# Patient Record
Sex: Female | Born: 1995 | Race: Black or African American | Hispanic: No | Marital: Single
Health system: Southern US, Community
[De-identification: ages and names within clinical notes are randomized; demographics above are authoritative.]

---

## 2020-10-01 ENCOUNTER — Emergency Department (HOSPITAL_COMMUNITY)

## 2020-10-01 ENCOUNTER — Other Ambulatory Visit: Payer: Self-pay

## 2020-10-01 ENCOUNTER — Encounter (HOSPITAL_COMMUNITY): Payer: Self-pay | Admitting: Emergency Medicine

## 2020-10-01 ENCOUNTER — Emergency Department (HOSPITAL_COMMUNITY)
Admission: EM | Admit: 2020-10-01 | Discharge: 2020-10-02 | Disposition: A | Discharge status: Home / Self Care | Attending: Emergency Medicine | Admitting: Emergency Medicine

## 2020-10-01 DIAGNOSIS — R202 Paresthesia of skin: Secondary | ICD-10-CM | POA: Insufficient documentation

## 2020-10-01 DIAGNOSIS — M549 Dorsalgia, unspecified: Secondary | ICD-10-CM | POA: Diagnosis present

## 2020-10-01 DIAGNOSIS — G8929 Other chronic pain: Secondary | ICD-10-CM | POA: Insufficient documentation

## 2020-10-01 DIAGNOSIS — M545 Low back pain, unspecified: Secondary | ICD-10-CM | POA: Insufficient documentation

## 2020-10-01 LAB — CBC WITH DIFFERENTIAL/PLATELET
Abs Immature Granulocytes: 0.02 10*3/uL (ref 0.00–0.07)
Basophils Absolute: 0 10*3/uL (ref 0.0–0.1)
Basophils Relative: 0 %
Eosinophils Absolute: 0.1 10*3/uL (ref 0.0–0.5)
Eosinophils Relative: 1 %
HCT: 40.5 % (ref 36.0–46.0)
Hemoglobin: 13 g/dL (ref 12.0–15.0)
Immature Granulocytes: 0 %
Lymphocytes Relative: 35 %
Lymphs Abs: 3.2 10*3/uL (ref 0.7–4.0)
MCH: 30.2 pg (ref 26.0–34.0)
MCHC: 32.1 g/dL (ref 30.0–36.0)
MCV: 94 fL (ref 80.0–100.0)
Monocytes Absolute: 0.5 10*3/uL (ref 0.1–1.0)
Monocytes Relative: 6 %
Neutro Abs: 5.3 10*3/uL (ref 1.7–7.7)
Neutrophils Relative %: 58 %
Platelets: 230 10*3/uL (ref 150–400)
RBC: 4.31 MIL/uL (ref 3.87–5.11)
RDW: 13.2 % (ref 11.5–15.5)
WBC: 9.2 10*3/uL (ref 4.0–10.5)
nRBC: 0 % (ref 0.0–0.2)

## 2020-10-01 IMAGING — MR MR HEAD WO/W CM
14 of 16 series · 42 of 48 positions shown · IV contrast (gadavist)
Comparison: None.

CLINICAL DATA: Left lower extremity numbness

EXAM:
MRI HEAD WITHOUT AND WITH CONTRAST
MRI CERVICAL AND THORACIC SPINE WITHOUT AND WITH CONTRAST
TECHNIQUE: Multiplanar, multiecho pulse sequences of the brain and surrounding
structures, and cervical and thoracic spine, to include the
craniocervical junction and cervicothoracic junction, were obtained
without and with intravenous contrast.
CONTRAST:  5.5mL GADAVIST GADOBUTROL 1 MMOL/ML IV SOLN

[Series 5: DWI · axial · 3.0mm · 0.88mm/px · z∈[-100,+41]mm · 8 of 96 slices shown (1 of 4)]
[im 1/96]
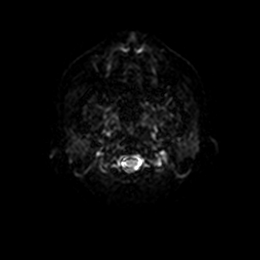
[im 14/96]
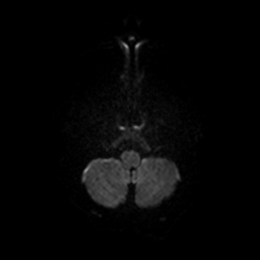
[im 28/96]
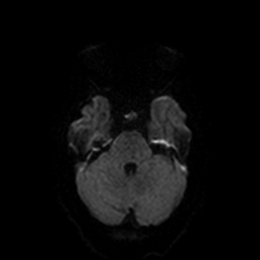
[im 41/96]
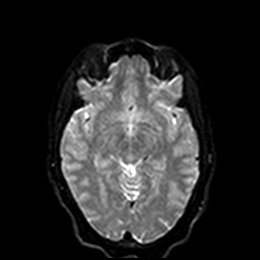
[im 55/96]
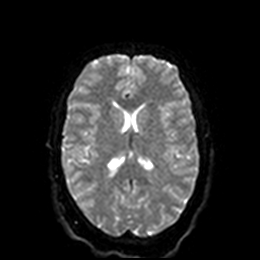
[im 68/96]
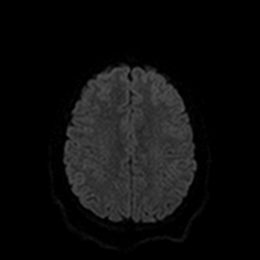
[im 82/96]
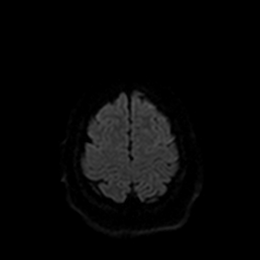
[im 96/96]
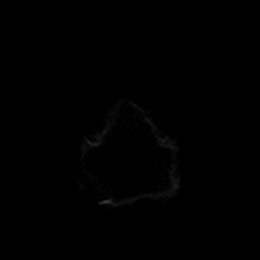

[Series 6: DWI · axial · 3.0mm · 0.88mm/px · z∈[-100,+41]mm · 3 of 48 slices shown (2 of 4)]
[im 1/48]
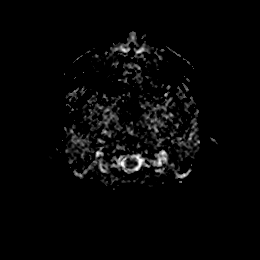
[im 24/48]
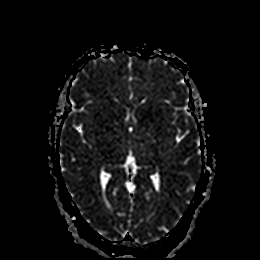
[im 48/48]
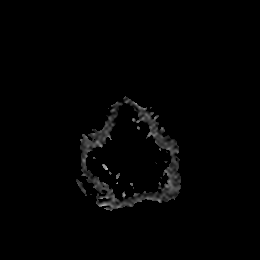

[Series 7: DWI · coronal · 4.0mm · 0.88mm/px · 5 of 68 slices shown (3 of 4)]
[im 1/68]
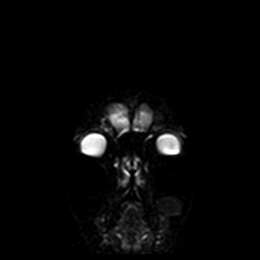
[im 17/68]
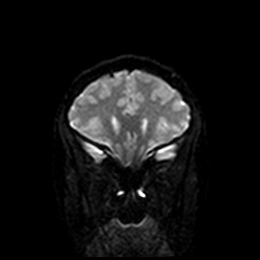
[im 34/68]
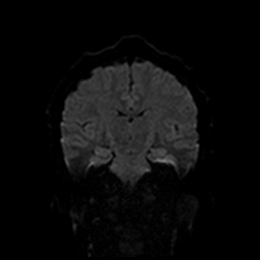
[im 51/68]
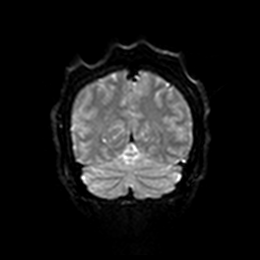
[im 68/68]
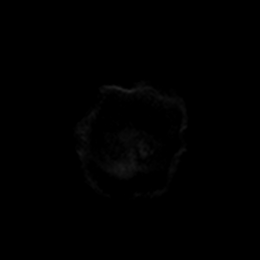

[Series 8: DWI · coronal · 4.0mm · 0.88mm/px · 2 of 34 slices shown (4 of 4)]
[im 1/34]
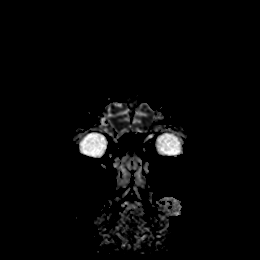
[im 34/34]
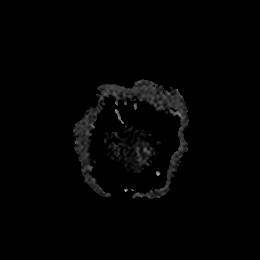

[Series 9: T1 · sagittal · 5.0mm · 0.75mm/px · 2 of 23 slices shown]
[im 1/23]
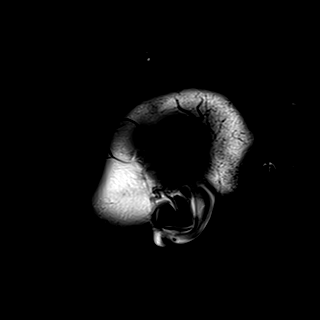
[im 23/23]
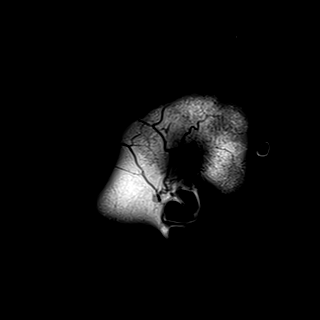

[Series 10: T2 · axial · 5.0mm · 0.72mm/px · z∈[-101,+43]mm · 2 of 25 slices shown]
[im 1/25]
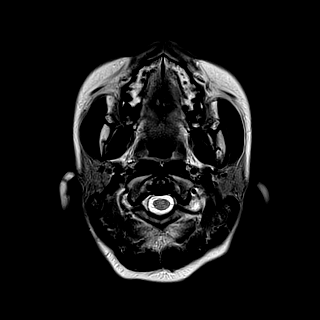
[im 25/25]
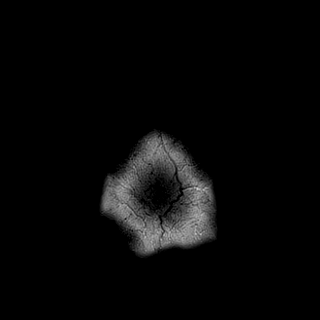

[Series 11: FLAIR · axial · 5.0mm · 0.45mm/px · z∈[-101,+43]mm · 2 of 25 slices shown]
[im 1/25]
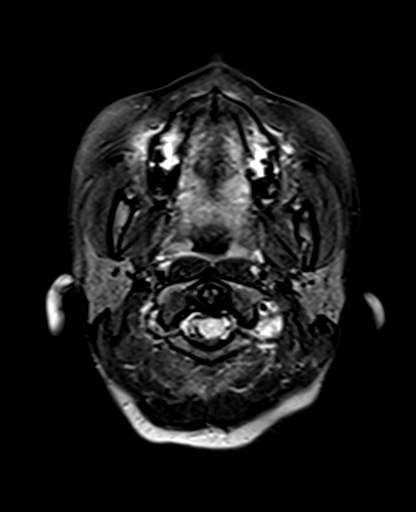
[im 25/25]
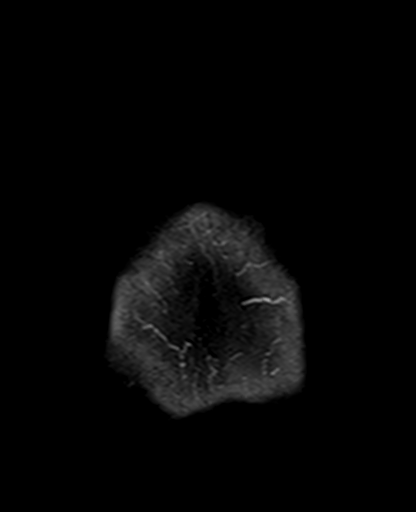

[Series 12: mag_images · axial · 3.0mm · 0.90mm/px · z∈[-99,+42]mm · 3 of 48 slices shown]
[im 1/48]
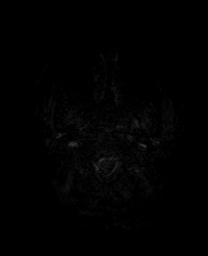
[im 24/48]
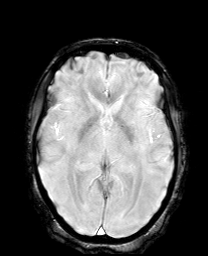
[im 48/48]
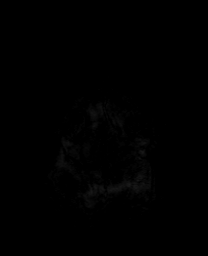

[Series 13: pha_images · axial · 3.0mm · 0.90mm/px · z∈[-99,+42]mm · 3 of 48 slices shown]
[im 1/48]
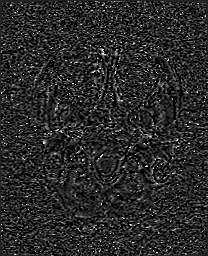
[im 24/48]
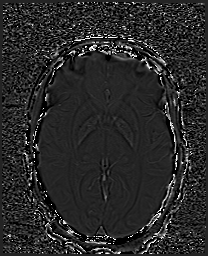
[im 48/48]
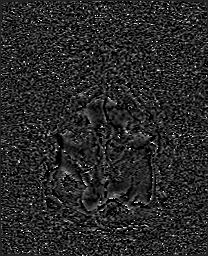

[Series 14: swi_images · axial · 3.0mm · 0.90mm/px · z∈[-99,+42]mm · 3 of 48 slices shown]
[im 1/48]
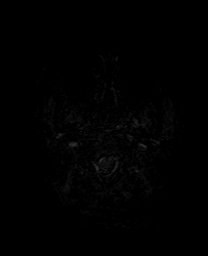
[im 24/48]
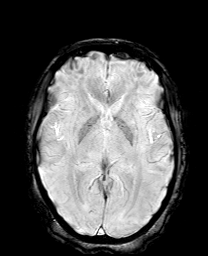
[im 48/48]
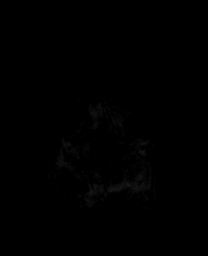

[Series 15: mip_images(sw) · axial · 24.0mm · 0.90mm/px · z∈[-89,+31]mm · 3 of 41 slices shown]
[im 1/41]
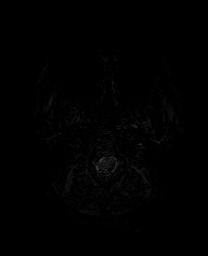
[im 21/41]
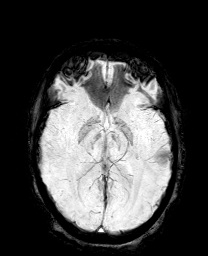
[im 41/41]
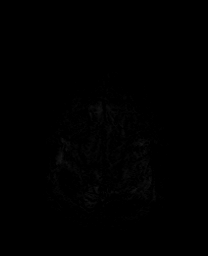

[Series 17: T2 post-contrast · coronal · 5.0mm · 0.72mm/px · 2 of 28 slices shown]
[im 1/28]
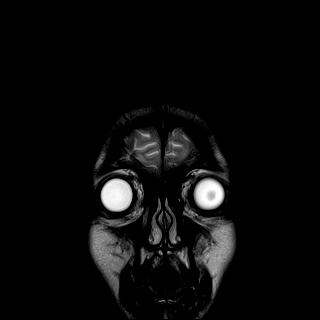
[im 28/28]
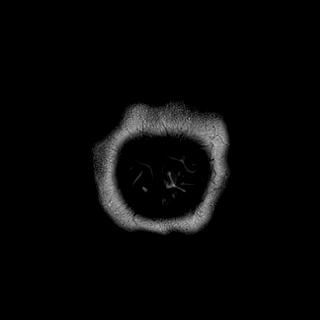

[Series 19: T1 post-contrast · coronal · 5.0mm · 0.34mm/px · 2 of 28 slices shown (1 of 2)]
[im 1/28]
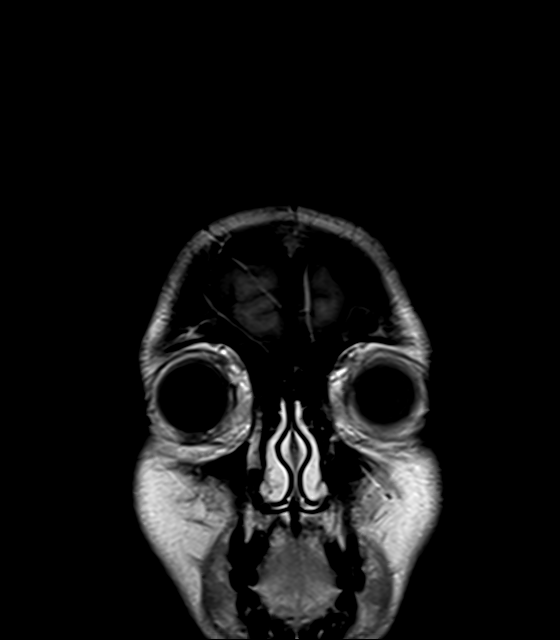
[im 28/28]
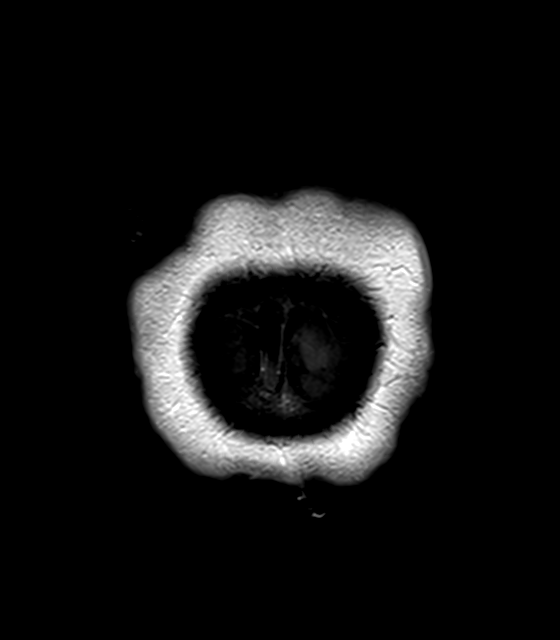

[Series 20: T1 post-contrast · sagittal · 5.0mm · 0.72mm/px · 2 of 23 slices shown (2 of 2)]
[im 1/23]
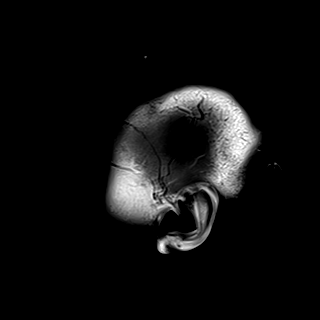
[im 23/23]
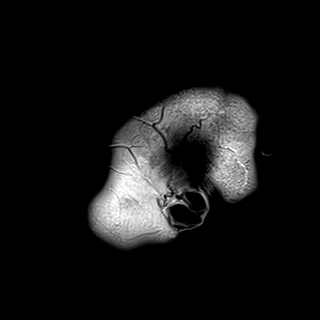

[42 of 48 positions shown; findings below may reference images not displayed]

FINDINGS: MRI HEAD FINDINGS

Brain: No acute infarct, acute hemorrhage or extra-axial collection.
Normal white matter signal. Normal volume of CSF spaces. No chronic
microhemorrhage. Normal midline structures.

Vascular: Major flow voids are preserved.

Skull and upper cervical spine: Normal calvarium and skull base.
Visualized upper cervical spine and soft tissues are normal.

Sinuses/Orbits:No paranasal sinus fluid levels or advanced mucosal
thickening. No mastoid or middle ear effusion. Normal orbits.

MRI CERVICAL SPINE FINDINGS

Alignment: Physiologic.

Vertebrae: No fracture, evidence of discitis, or bone lesion.

Cord: Normal signal and morphology.

Posterior Fossa, vertebral arteries, paraspinal tissues: Negative.

Disc levels: No spinal canal or neural foraminal stenosis.

MRI THORACIC SPINE FINDINGS

Alignment: Physiologic.

Vertebrae: No fracture, evidence of discitis, or bone lesion.

Cord: Normal signal and morphology.

Paraspinal tissues: Negative.

Disc levels: No disc herniation, spinal canal stenosis or neural
foraminal stenosis.
IMPRESSION: Normal MRI of the brain, cervical spine and thoracic spine.

## 2020-10-01 IMAGING — MR MR THORACIC SPINE WO/W CM
5 of 9 series · 23 of 48 positions shown · IV contrast (gadavist)
Comparison: None.

CLINICAL DATA: Left lower extremity numbness

EXAM:
MRI HEAD WITHOUT AND WITH CONTRAST
MRI CERVICAL AND THORACIC SPINE WITHOUT AND WITH CONTRAST
TECHNIQUE: Multiplanar, multiecho pulse sequences of the brain and surrounding
structures, and cervical and thoracic spine, to include the
craniocervical junction and cervicothoracic junction, were obtained
without and with intravenous contrast.
CONTRAST:  5.5mL GADAVIST GADOBUTROL 1 MMOL/ML IV SOLN

[Series 22: T1 · sagittal · 6.0mm · 1.23mm/px · 1 of 9 slices shown (1 of 3)]
[im 1/9]
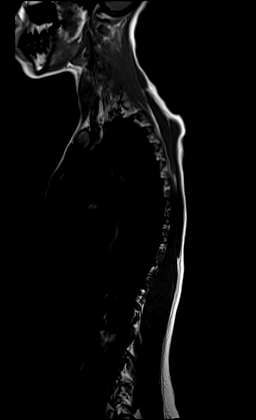

[Series 23: T2 · sagittal · 3.0mm · 0.76mm/px · 3 of 17 slices shown (1 of 2)]
[im 1/17]
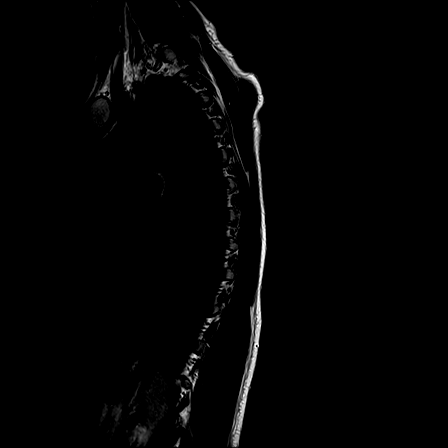
[im 9/17]
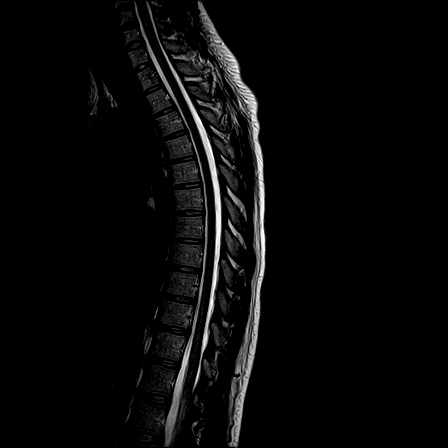
[im 17/17]
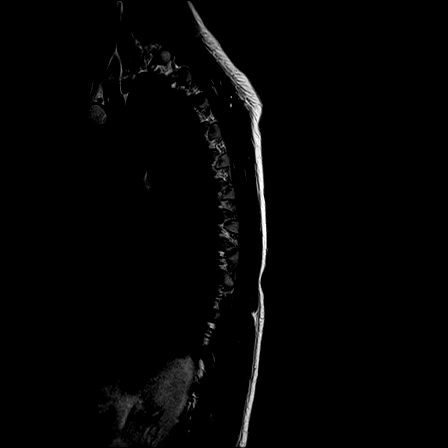

[Series 24: T1 · sagittal · 3.0mm · 0.76mm/px · 4 of 17 slices shown (2 of 3)]
[im 1/17]
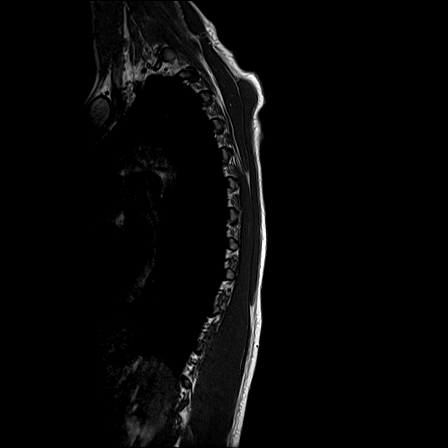
[im 6/17]
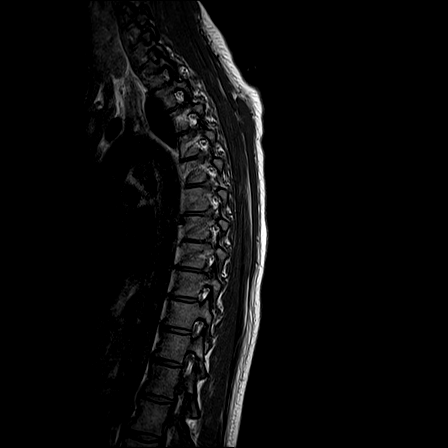
[im 11/17]
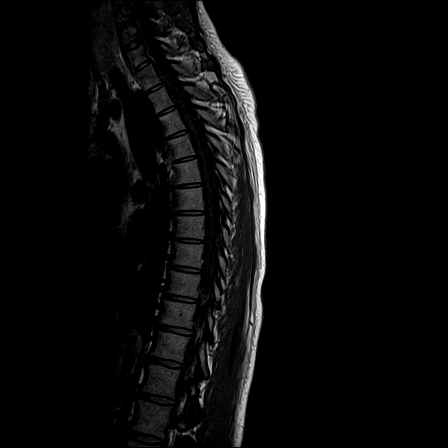
[im 17/17]
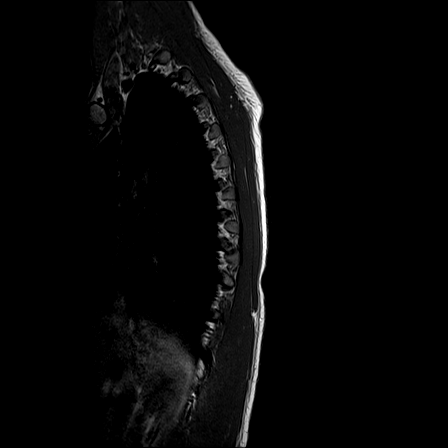

[Series 26: T2 · axial · 4.0mm · 0.59mm/px · z∈[-436,-229]mm · 8 of 39 slices shown (2 of 2)]
[im 1/39]
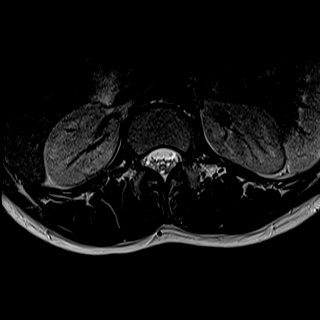
[im 6/39]
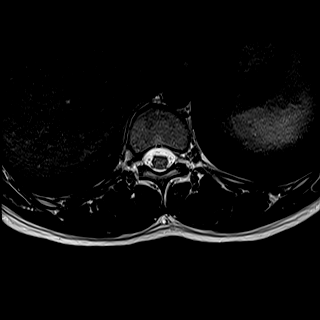
[im 11/39]
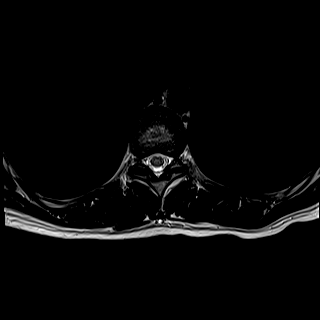
[im 17/39]
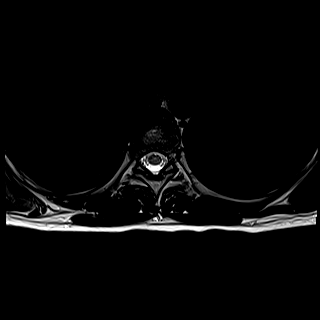
[im 22/39]
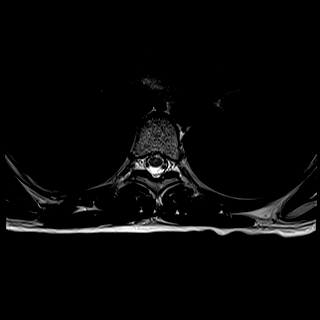
[im 28/39]
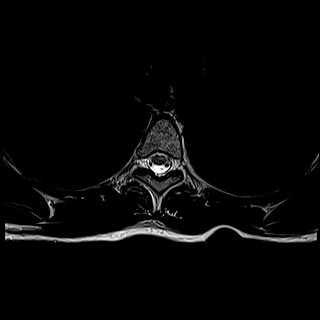
[im 33/39]
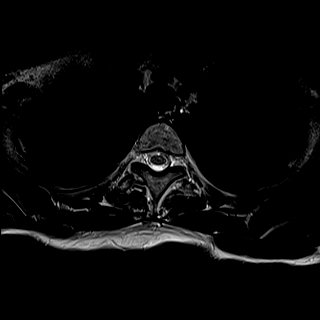
[im 39/39]
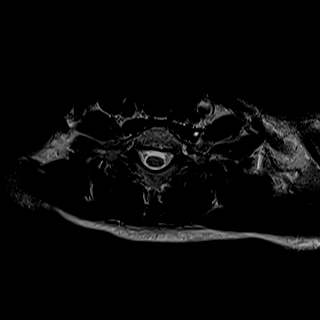

[Series 28: T1 · axial · non-contrast · 4.0mm · 0.31mm/px · z∈[-436,-266]mm · 7 of 39 slices shown (3 of 3)]
[im 1/39]
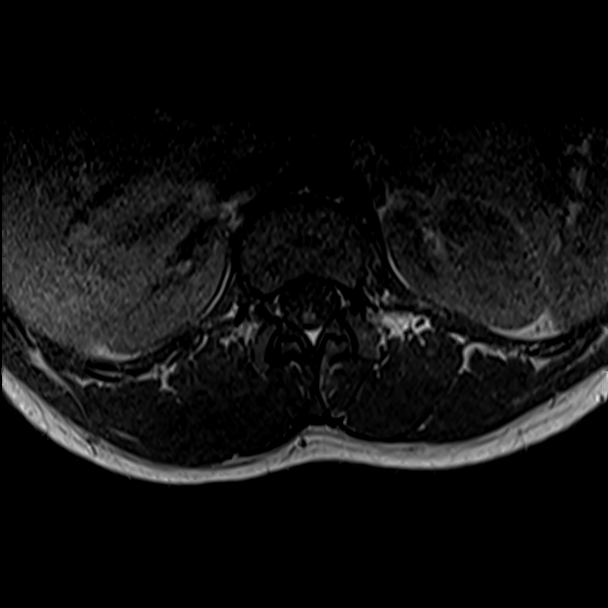
[im 6/39]
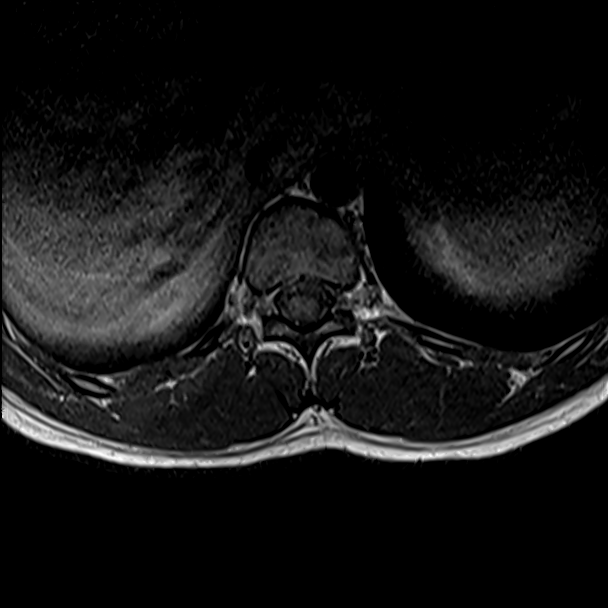
[im 11/39]
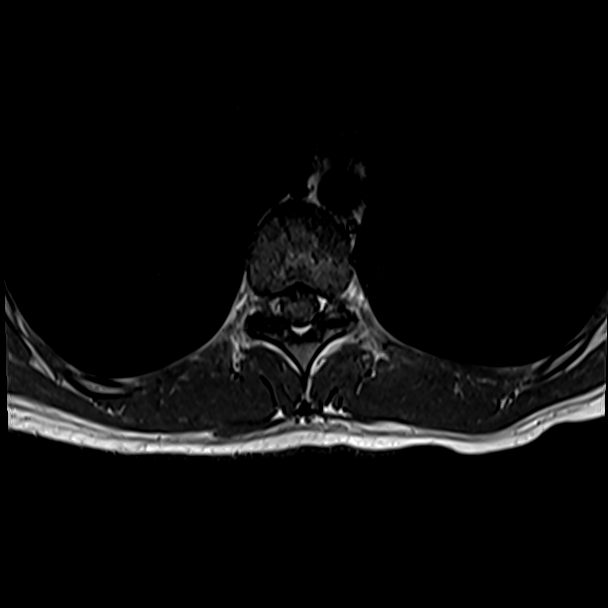
[im 17/39]
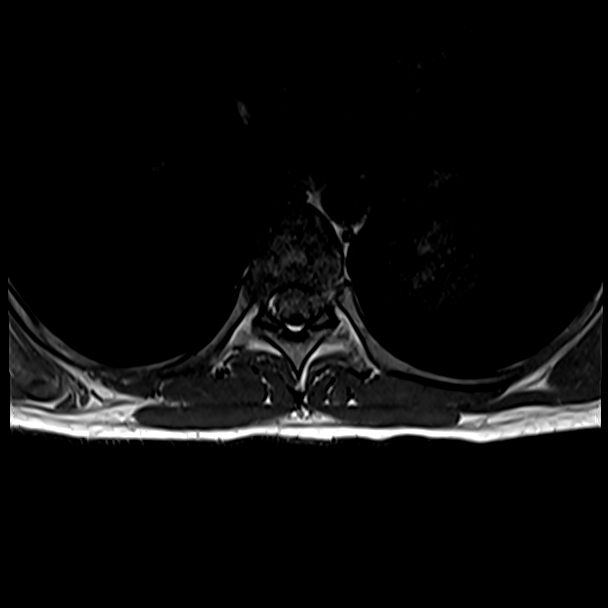
[im 22/39]
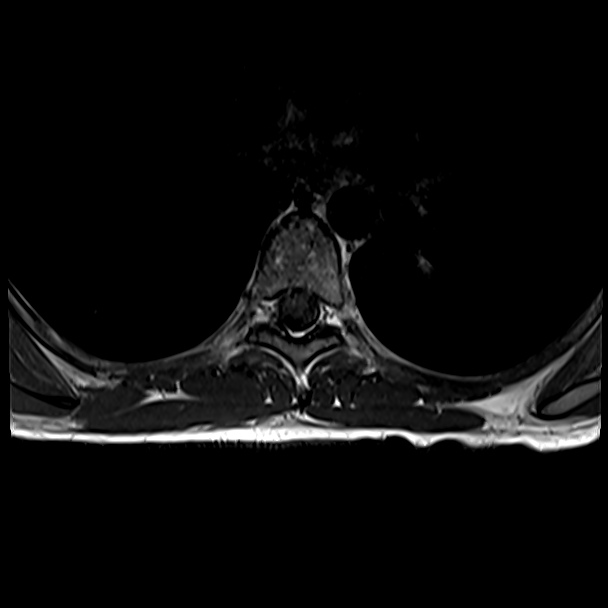
[im 28/39]
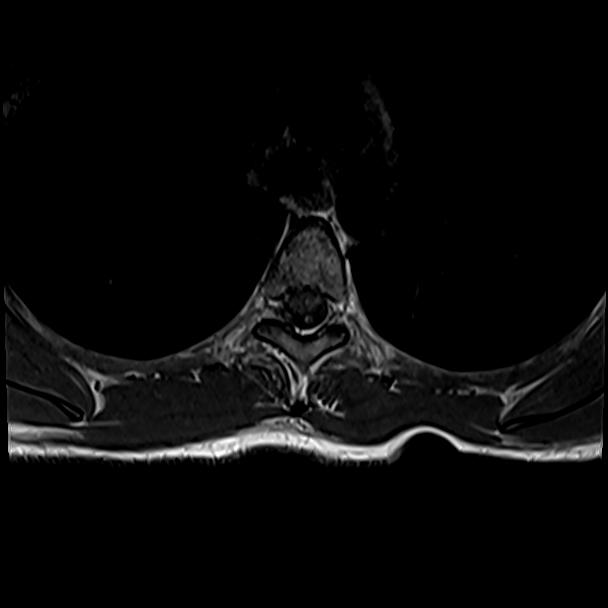
[im 33/39]
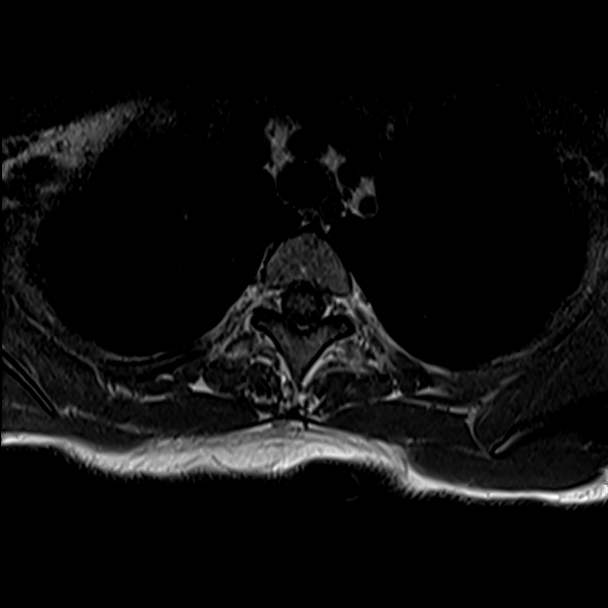

[23 of 48 positions shown; findings below may reference images not displayed]

FINDINGS: MRI HEAD FINDINGS

Brain: No acute infarct, acute hemorrhage or extra-axial collection.
Normal white matter signal. Normal volume of CSF spaces. No chronic
microhemorrhage. Normal midline structures.

Vascular: Major flow voids are preserved.

Skull and upper cervical spine: Normal calvarium and skull base.
Visualized upper cervical spine and soft tissues are normal.

Sinuses/Orbits:No paranasal sinus fluid levels or advanced mucosal
thickening. No mastoid or middle ear effusion. Normal orbits.

MRI CERVICAL SPINE FINDINGS

Alignment: Physiologic.

Vertebrae: No fracture, evidence of discitis, or bone lesion.

Cord: Normal signal and morphology.

Posterior Fossa, vertebral arteries, paraspinal tissues: Negative.

Disc levels: No spinal canal or neural foraminal stenosis.

MRI THORACIC SPINE FINDINGS

Alignment: Physiologic.

Vertebrae: No fracture, evidence of discitis, or bone lesion.

Cord: Normal signal and morphology.

Paraspinal tissues: Negative.

Disc levels: No disc herniation, spinal canal stenosis or neural
foraminal stenosis.
IMPRESSION: Normal MRI of the brain, cervical spine and thoracic spine.

## 2020-10-01 IMAGING — MR MR CERVICAL SPINE WO/W CM
6 of 8 series · 31 of 48 positions shown · IV contrast (gadavist)
Comparison: None.

CLINICAL DATA: Left lower extremity numbness

EXAM:
MRI HEAD WITHOUT AND WITH CONTRAST
MRI CERVICAL AND THORACIC SPINE WITHOUT AND WITH CONTRAST
TECHNIQUE: Multiplanar, multiecho pulse sequences of the brain and surrounding
structures, and cervical and thoracic spine, to include the
craniocervical junction and cervicothoracic junction, were obtained
without and with intravenous contrast.
CONTRAST:  5.5mL GADAVIST GADOBUTROL 1 MMOL/ML IV SOLN

[Series 13: T2 · sagittal · 3.0mm · 0.69mm/px · 4 of 15 slices shown (1 of 2)]
[im 1/15]
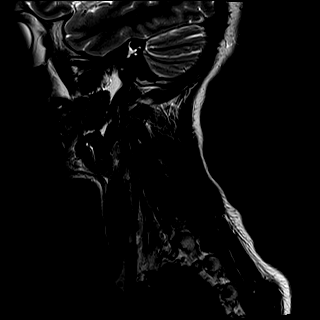
[im 5/15]
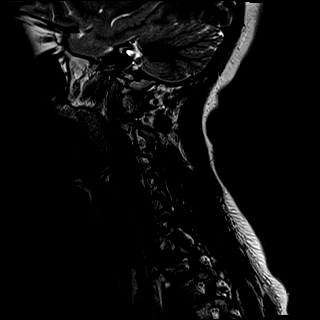
[im 10/15]
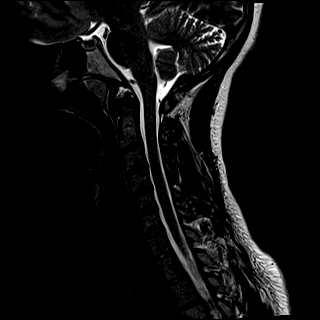
[im 15/15]
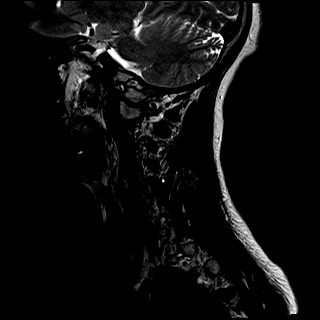

[Series 14: T1 · sagittal · 3.0mm · 0.69mm/px · 4 of 15 slices shown (1 of 2)]
[im 1/15]
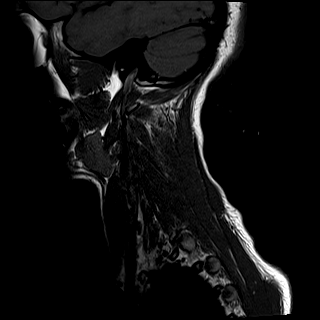
[im 5/15]
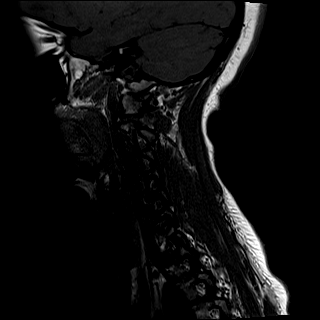
[im 10/15]
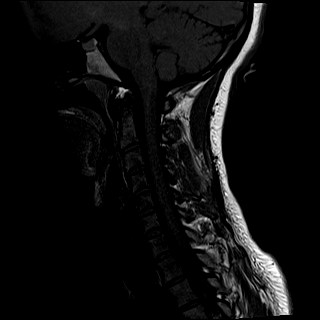
[im 15/15]
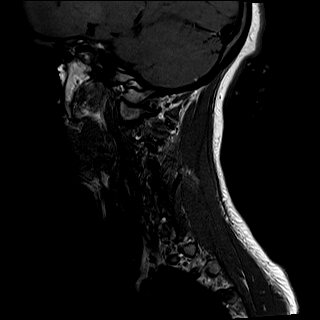

[Series 15: STIR · sagittal · 3.0mm · 0.86mm/px · 4 of 15 slices shown]
[im 1/15]
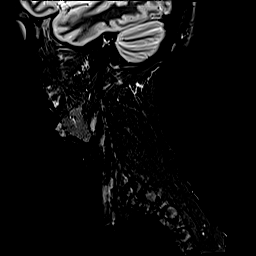
[im 5/15]
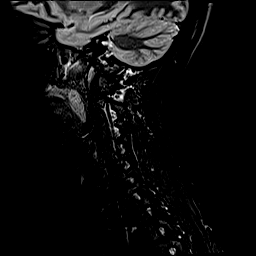
[im 10/15]
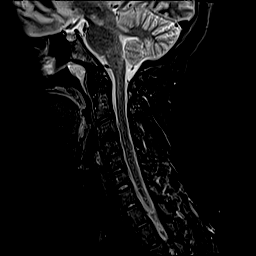
[im 15/15]
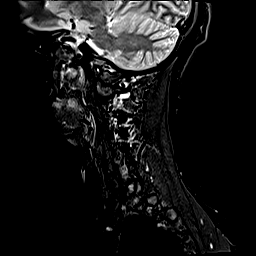

[Series 16: T2 · axial · 3.0mm · 0.66mm/px · z∈[-249,-137]mm · 8 of 37 slices shown (2 of 2)]
[im 1/37]
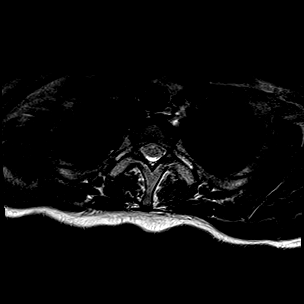
[im 6/37]
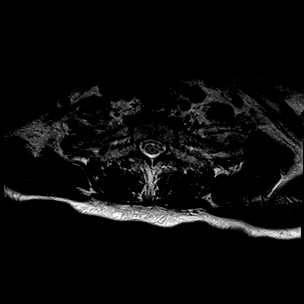
[im 11/37]
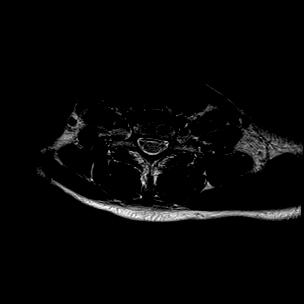
[im 16/37]
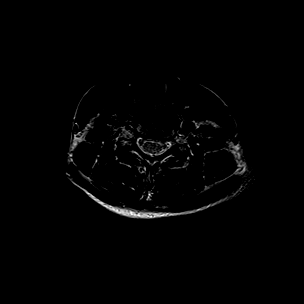
[im 21/37]
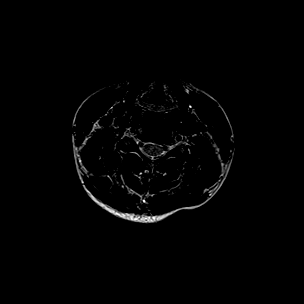
[im 26/37]
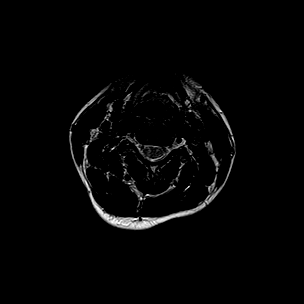
[im 31/37]
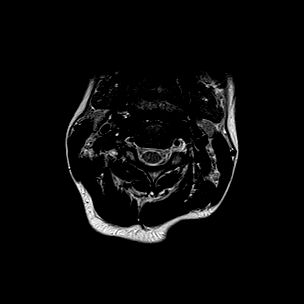
[im 37/37]
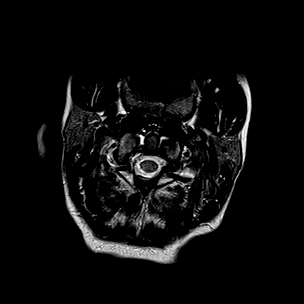

[Series 18: T1 · axial · 3.0mm · 0.39mm/px · z∈[-249,-137]mm · 8 of 37 slices shown (2 of 2)]
[im 1/37]
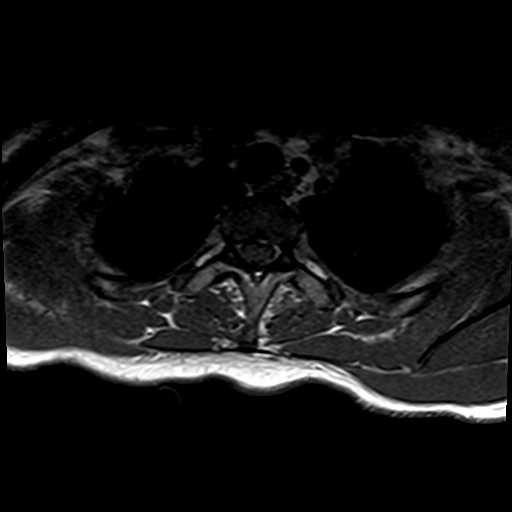
[im 6/37]
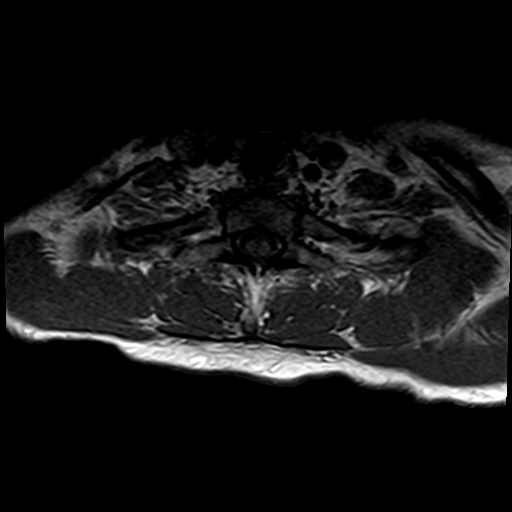
[im 11/37]
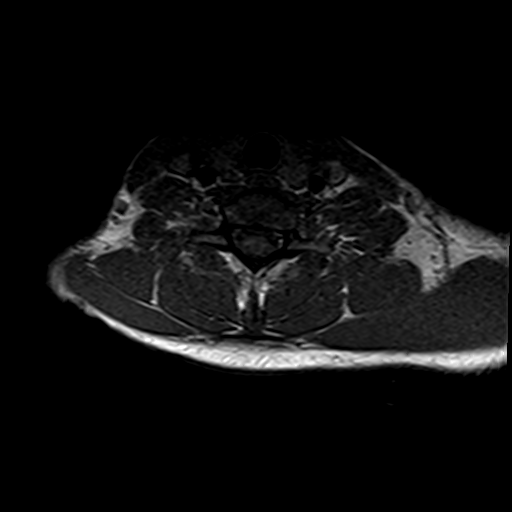
[im 16/37]
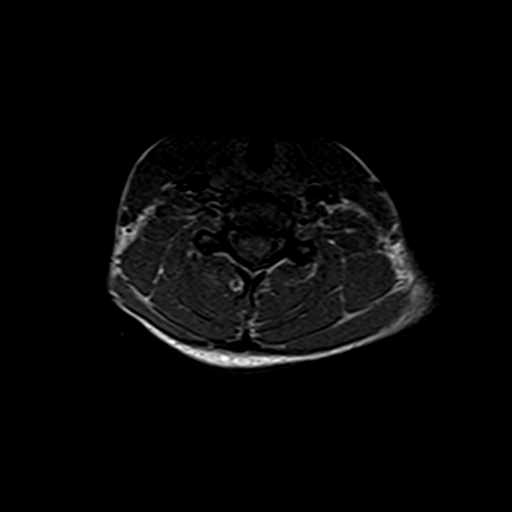
[im 21/37]
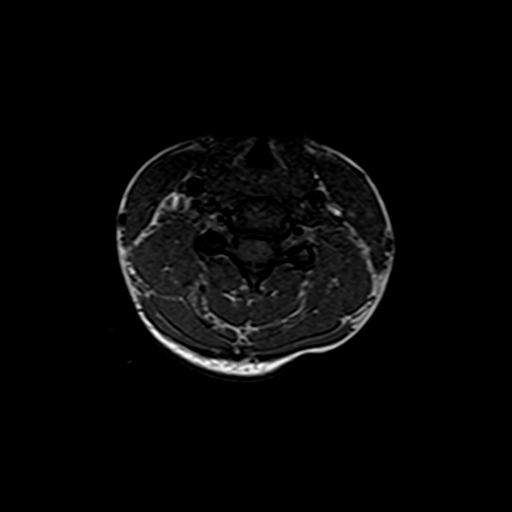
[im 26/37]
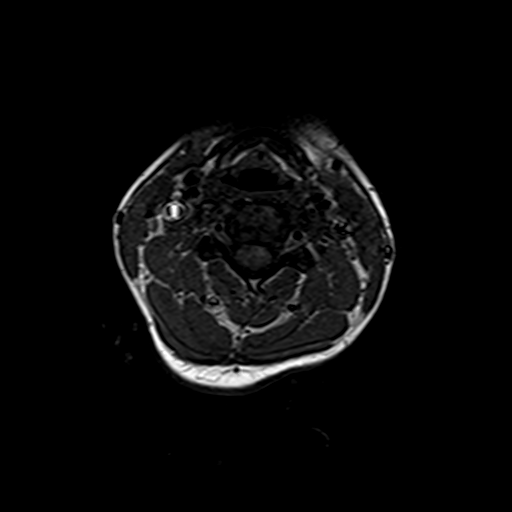
[im 31/37]
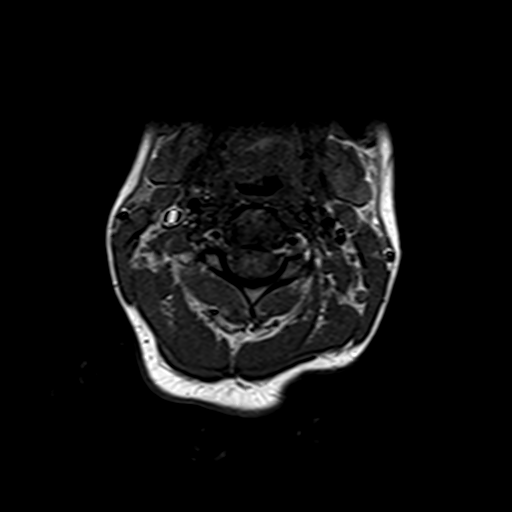
[im 37/37]
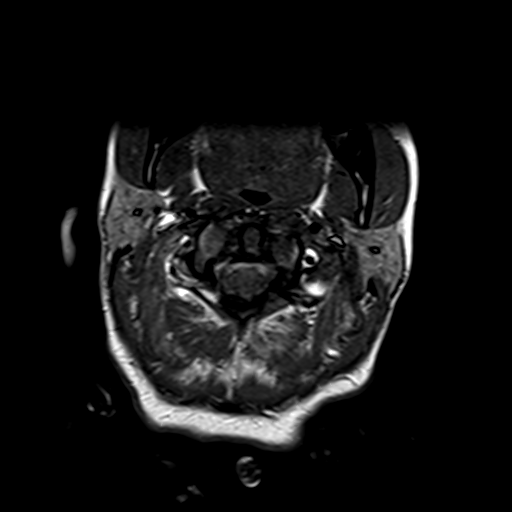

[Series 19: T1 fat-sat post-contrast · sagittal · 3.0mm · 0.43mm/px · 3 of 15 slices shown]
[im 1/15]
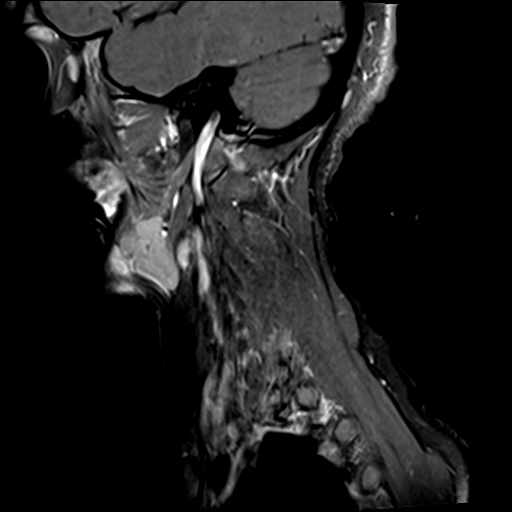
[im 8/15]
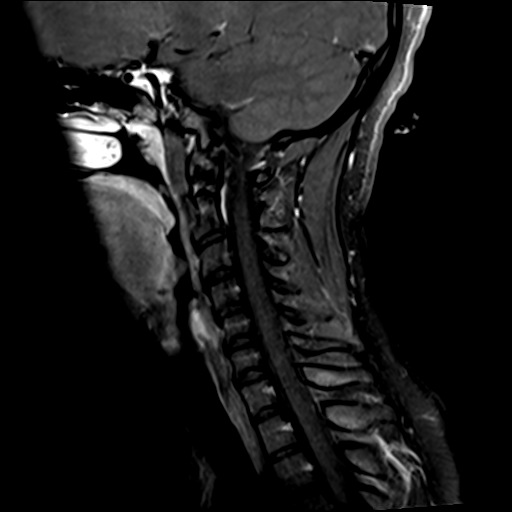
[im 15/15]
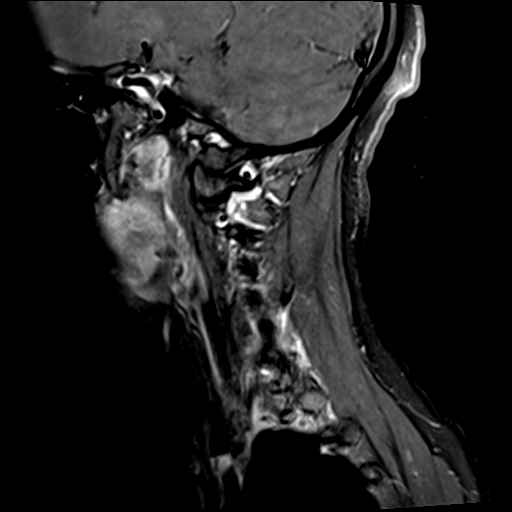

[31 of 48 positions shown; findings below may reference images not displayed]

FINDINGS: MRI HEAD FINDINGS

Brain: No acute infarct, acute hemorrhage or extra-axial collection.
Normal white matter signal. Normal volume of CSF spaces. No chronic
microhemorrhage. Normal midline structures.

Vascular: Major flow voids are preserved.

Skull and upper cervical spine: Normal calvarium and skull base.
Visualized upper cervical spine and soft tissues are normal.

Sinuses/Orbits:No paranasal sinus fluid levels or advanced mucosal
thickening. No mastoid or middle ear effusion. Normal orbits.

MRI CERVICAL SPINE FINDINGS

Alignment: Physiologic.

Vertebrae: No fracture, evidence of discitis, or bone lesion.

Cord: Normal signal and morphology.

Posterior Fossa, vertebral arteries, paraspinal tissues: Negative.

Disc levels: No spinal canal or neural foraminal stenosis.

MRI THORACIC SPINE FINDINGS

Alignment: Physiologic.

Vertebrae: No fracture, evidence of discitis, or bone lesion.

Cord: Normal signal and morphology.

Paraspinal tissues: Negative.

Disc levels: No disc herniation, spinal canal stenosis or neural
foraminal stenosis.
IMPRESSION: Normal MRI of the brain, cervical spine and thoracic spine.

## 2020-10-01 NOTE — ED Provider Notes (Signed)
[facility] EMERGENCY DEPARTMENT Provider Note   CSN: [id number] Arrival date & time: 10/01/20  1809     History Chief Complaint  Patient presents with  . Back Pain  . Numbness    Dawn Chavez is a 24 y.o. female with a past medical history of "bulging disc in my lower back," diagnosed several years ago after an accident presenting to the ED with a chief complaint of back pain and left lower extremity numbness.  States that she typically gets lower back pain that is not unusual for her.  Yesterday about 24 hours ago started developing numbness in her left lower extremity.  She states "my leg feels cold, like there's no circulation, and it feels different when you touch it."  This starts in her left knee and goes down to her ankle.  It does not involve the thigh or the foot.  States that the symptoms have never happened to her before.  She remains ambulatory but does have trouble "getting my footing right."  She denies any chest pain, fever, prior back surgeries, loss of bowel or bladder function, saddle anesthesia, injuries, falls, IV drug use, possibility of pregnancy.  She had been prescribed muscle relaxer for her back pain in the past but has not needed to take it. Denies headache.  HPI     History reviewed. No pertinent past medical history.  There are no problems to display for this patient.    OB History   No obstetric history on file.     History reviewed. No pertinent family history.  Social History   Tobacco Use  . Smoking status: Not on file  Substance Use Topics  . Alcohol use: Not on file  . Drug use: Not on file    Home Medications Prior to Admission medications   Not on File    Allergies    Patient has no known allergies.  Review of Systems   Review of Systems  Constitutional: Negative for appetite change, chills and fever.  HENT: Negative for ear pain, rhinorrhea, sneezing and sore throat.   Eyes: Negative for photophobia  and visual disturbance.  Respiratory: Negative for cough, chest tightness, shortness of breath and wheezing.   Cardiovascular: Negative for chest pain and palpitations.  Gastrointestinal: Negative for abdominal pain, blood in stool, constipation, diarrhea, nausea and vomiting.  Genitourinary: Negative for dysuria, hematuria and urgency.  Musculoskeletal: Positive for back pain. Negative for myalgias.  Skin: Negative for rash.  Neurological: Positive for numbness. Negative for dizziness, weakness and light-headedness.    Physical Exam Updated Vital Signs BP 127/74 (BP Location: Right Arm)   Pulse 73   Temp 98.4 F (36.9 C) (Oral)   Resp 15   Ht 5\' 2"  (1.575 m)   Wt 54.4 kg   SpO2 98%   BMI 21.95 kg/m   Physical Exam Vitals and nursing note reviewed.  Constitutional:      General: She is not in acute distress.    Appearance: She is well-developed.  HENT:     Head: Normocephalic and atraumatic.     Nose: Nose normal.  Eyes:     General: No scleral icterus.       Right eye: No discharge.        Left eye: No discharge.     Conjunctiva/sclera: Conjunctivae normal.  Cardiovascular:     Rate and Rhythm: Normal rate and regular rhythm.     Heart sounds: Normal heart sounds. No murmur heard.  No  friction rub. No gallop.   Pulmonary:     Effort: Pulmonary effort is normal. No respiratory distress.     Breath sounds: Normal breath sounds.  Abdominal:     General: Bowel sounds are normal. There is no distension.     Palpations: Abdomen is soft.     Tenderness: There is no abdominal tenderness. There is no guarding.  Musculoskeletal:        General: Normal range of motion.     Cervical back: Normal range of motion and neck supple.  Skin:    General: Skin is warm and dry.     Findings: No rash.  Neurological:     Mental Status: She is alert.     Sensory: Sensory deficit present.     Motor: No weakness or abnormal muscle tone.     Coordination: Coordination normal.      Comments: Strength 5/5 in bilateral lower extremities.  Patient reports diminished sensation to palpation of her left lower leg circumferential from left knee to left ankle.  No significant swelling noted.  2+ DP pulse noted bilaterally.  No clonus noted.     ED Results / Procedures / Treatments   Labs (all labs ordered are listed, but only abnormal results are displayed) Labs Reviewed  CBC WITH DIFFERENTIAL/PLATELET  BASIC METABOLIC PANEL    EKG None  Radiology No results found.  Procedures Procedures (including critical care time)  Medications Ordered in ED Medications - No data to display  ED Course  I have reviewed the triage vital signs and the nursing notes.  Pertinent labs & imaging results that were available during my care of the patient were reviewed by me and considered in my medical decision making (see chart for details).  Clinical Course as of Oct 01 2299  Tue Oct 01, 2020  2137 Spoke to on-call neurologist, Dr. Iver Nestle. Reviewed patient's physical exam findings, symptoms and history.  She will see the patient in consult but in the meantime recommends MRI of the brain, cervical and thoracic spine which she has placed orders for.   [HK]  2258 No abnormalities.  CBC with Differential [HK]    Clinical Course User Index [HK] Dietrich Pates, PA-C   MDM Rules/Calculators/A&P                          24 year old female with a past medical history of bulging disc in lower back diagnosed several years ago after an accident presenting to the ED with a chief complaint of back pain and left lower extremity numbness.  States that getting her lower back pain is not unusual for her.  Yesterday about 24 hours ago started developing numbness in her left lower extremity from her knee down to her calf circumferentially.  Reports feeling like there is a numbness, decreased sensation and circulation to the area.  Remains ambulatory.  She also reports paresthesias in her left upper  extremity that occurred yesterday which is not typical for her as well.  Denies any numbness in this area.  Denies headache, saddle anesthesia, loss of bowel or bladder function, IV drug use.  On exam patient reports diminished sensation to the left lower leg area circumferentially.  Equal and intact distal pulses.  Good strength noted to bilateral upper and lower extremities.  Normal sensation light touch of bilateral upper extremities.  No clonus noted.  No significant edema or calf tenderness noted bilaterally.  Per neurology recommendations will obtain MRI of  the brain, cervical spine and thoracic spine to evaluate for any demyelinating disease.  Patient also would like to be checked for DVT.  We do not have ultrasound available at this time but I will arrange for this outpatient in the morning in the event that she gets discharged from the ER tonight after unremarkable workup. I appreciate the help of neurologist for management of this patient. Care handed off to oncoming provider pending remainder of workup to determine final disposition.  Portions of this note were generated with Scientist, clinical (histocompatibility and immunogenetics). Dictation errors may occur despite best attempts at proofreading.   Final Clinical Impression(s) / ED Diagnoses Final diagnoses:  Chronic midline low back pain without sciatica    Rx / DC Orders ED Discharge Orders         Ordered    VAS [place] LOWER EXTREMITY VENOUS (DVT)        10/01/20 2258           Dietrich Pates, PA-C 10/01/20 2305    Sabino Donovan, MD 10/01/20 [phone]
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
## 2020-10-01 NOTE — ED Provider Notes (Signed)
24 year old female received at sign out from [facility] pending MRI and neuro consult. Per her HPI:   "NAYA ILAGAN is a 24 y.o. female with a past medical history of "bulging disc in my lower back," diagnosed several years ago after an accident presenting to the ED with a chief complaint of back pain and left lower extremity numbness.  States that she typically gets lower back pain that is not unusual for her.  Yesterday about 24 hours ago started developing numbness in her left lower extremity.  She states "my leg feels cold, like there's no circulation, and it feels different when you touch it."  This starts in her left knee and goes down to her ankle.  It does not involve the thigh or the foot.  States that the symptoms have never happened to her before.  She remains ambulatory but does have trouble "getting my footing right."  She denies any chest pain, fever, prior back surgeries, loss of bowel or bladder function, saddle anesthesia, injuries, falls, IV drug use, possibility of pregnancy.  She had been prescribed muscle relaxer for her back pain in the past but has not needed to take it. Denies headache."  Physical Exam  BP 127/74 (BP Location: Right Arm)   Pulse 73   Temp 98.4 F (36.9 C) (Oral)   Resp 15   Ht 5\' 2"  (1.575 m)   Wt 54.4 kg   SpO2 98%   BMI 21.95 kg/m   Physical Exam Vitals and nursing note reviewed.  Constitutional:      Appearance: She is well-developed.  HENT:     Head: Normocephalic and atraumatic.  Musculoskeletal:     Cervical back: Neck supple.     Right lower leg: No edema.     Left lower leg: No edema.  Neurological:     Mental Status: She is alert and oriented to person, place, and time.     ED Course/Procedures   Clinical Course as of Oct 01 2316  Tue Oct 01, 2020  2137 Spoke to on-call neurologist, Dr. Oct 03, 2020. Reviewed patient's physical exam findings, symptoms and history.  She will see the patient in consult but in the meantime recommends MRI  of the brain, cervical and thoracic spine which she has placed orders for.   [HK]  2258 No abnormalities.  CBC with Differential [HK]    Clinical Course User Index [HK] 2259, PA-C    Procedures  MDM  24 year old female received a signout from [facility] pending neurology consult and MRI.  Please see her note for further work-up and medical decision making.  In brief, patient is presenting with low back pain and left lower extremity numbness that extends from her knee to her calf circumferentially.  Imaging has been reviewed and independently interpreted by me.  There are no acute findings on MRI.  The patient was evaluated by Dr. [facility], neurology, who has reviewed your imaging and evaluated the patient bedside as there was concern for multiple sclerosis.  Her recommendation was that the patient can be discharged to home with follow-up with her primary care medical team.  Patient is agreeable with this plan at this time.  Home RICE therapy recommendations given.  She is hemodynamically stable and in no acute distress.  Safe for discharge home with outpatient follow-up as indicated.   Iver Nestle, PA-C 10/02/20 0325    14/01/21, MD 10/02/20 [phone]

## 2020-10-01 NOTE — ED Triage Notes (Signed)
Patient arrives to ED with c/o of upper back pain and right lower leg numbness that started at 9pm yesterday. Pt states that she has a known bulging disc. Pt states that the leg is not completely numb but feels off. Pt states symptoms have not gotten better since yesterday but actually a little worse.

## 2020-10-02 LAB — BASIC METABOLIC PANEL
Anion gap: 12 (ref 5–15)
BUN: 6 mg/dL (ref 6–20)
CO2: 23 mmol/L (ref 22–32)
Calcium: 9.2 mg/dL (ref 8.9–10.3)
Chloride: 101 mmol/L (ref 98–111)
Creatinine, Ser: 0.86 mg/dL (ref 0.44–1.00)
GFR, Estimated: >60 mL/min (ref >60)
Glucose, Bld: 94 mg/dL (ref 70–99)
Potassium: 3.5 mmol/L (ref 3.5–5.1)
Sodium: 136 mmol/L (ref 135–145)

## 2020-10-02 MED ORDER — GADOBUTROL 1 MMOL/ML IV SOLN
5.5000 mL | Freq: Once | INTRAVENOUS | Status: AC | PRN
  Administered 2020-10-02: 5.5 mL via INTRAVENOUS

## 2020-10-02 NOTE — Discharge Instructions (Addendum)
Thank you for allowing me to care for you today in the Emergency Department.   Please follow-up with your medical care team at [facility].  Take 650 mg of Tylenol or 600 mg of ibuprofen with food every 6 hours for pain.  You can alternate between these 2 medications every 3 hours if your pain returns.  For instance, you can take Tylenol at noon, followed by a dose of ibuprofen at 3, followed by second dose of Tylenol and 6.  Apply ice packs to areas that are sore for 15 to 20 minutes up to 3-4 times a day.  Return to the emergency department if you become unable to walk, if you have episodes where you pee or poop on yourself, new weakness in your legs, if you become unable to lift your arm, or develop other new, concerning symptoms.

## 2024-06-13 ENCOUNTER — Telehealth: Payer: Self-pay

## 2024-06-13 NOTE — Telephone Encounter (Signed)
 Received referral from Dr Francis Balo at the [facility] in [place] @ [phone]. Referral is for sleep apnea. Placed in sleep mailbox

## 2024-07-17 ENCOUNTER — Telehealth: Payer: Self-pay | Admitting: Neurology

## 2024-07-17 NOTE — Telephone Encounter (Signed)
 LVM and sent text msg informing pt of need to reschedule 07/18/24 appt - MD out

## 2024-07-18 ENCOUNTER — Institutional Professional Consult (permissible substitution): Admitting: Neurology
